# Patient Record
Sex: Female | Born: 1960 | Race: White | Hispanic: No | Marital: Married | Smoking: Former smoker
Health system: Southern US, Community
[De-identification: ages and names within clinical notes are randomized; demographics above are authoritative.]

## PROBLEM LIST (undated history)

## (undated) HISTORY — PX: OTHER SURGICAL HISTORY: SHX169

## (undated) HISTORY — PX: FREE FLAP GRAFT: SHX1677

---

## 2012-06-13 ENCOUNTER — Ambulatory Visit: Payer: Self-pay | Admitting: Physician Assistant

## 2012-08-25 ENCOUNTER — Encounter: Payer: Self-pay | Admitting: Nurse Practitioner

## 2012-09-08 ENCOUNTER — Encounter: Payer: Self-pay | Admitting: Nurse Practitioner

## 2012-10-08 ENCOUNTER — Encounter: Payer: Self-pay | Admitting: Nurse Practitioner

## 2014-02-21 ENCOUNTER — Ambulatory Visit: Payer: Self-pay | Admitting: Family Medicine

## 2014-04-16 ENCOUNTER — Emergency Department: Payer: Self-pay | Admitting: Emergency Medicine

## 2014-04-16 LAB — CBC
HCT: 39.3 % (ref 35.0–47.0)
HGB: 13.3 g/dL (ref 12.0–16.0)
MCH: 30 pg (ref 26.0–34.0)
MCHC: 34 g/dL (ref 32.0–36.0)
MCV: 88 fL (ref 80–100)
Platelet: 219 10*3/uL (ref 150–440)
RBC: 4.45 10*6/uL (ref 3.80–5.20)
RDW: 12.8 % (ref 11.5–14.5)
WBC: 8.3 10*3/uL (ref 3.6–11.0)

## 2014-04-16 LAB — BASIC METABOLIC PANEL
ANION GAP: 6 — AB (ref 7–16)
BUN: 13 mg/dL (ref 7–18)
CALCIUM: 8.1 mg/dL — AB (ref 8.5–10.1)
CREATININE: 0.87 mg/dL (ref 0.60–1.30)
Chloride: 109 mmol/L — ABNORMAL HIGH (ref 98–107)
Co2: 28 mmol/L (ref 21–32)
EGFR (African American): >60
EGFR (Non-African Amer.): >60
GLUCOSE: 80 mg/dL (ref 65–99)
OSMOLALITY: 284 (ref 275–301)
POTASSIUM: 3.5 mmol/L (ref 3.5–5.1)
Sodium: 143 mmol/L (ref 136–145)

## 2014-04-16 LAB — URINALYSIS, COMPLETE
BACTERIA: NONE SEEN
Bilirubin,UR: NEGATIVE
Blood: NEGATIVE
Glucose,UR: NEGATIVE mg/dL (ref 0–75)
KETONE: NEGATIVE
Leukocyte Esterase: NEGATIVE
NITRITE: NEGATIVE
Ph: 6 (ref 4.5–8.0)
Protein: NEGATIVE
RBC,UR: <1 /HPF (ref 0–5)
SPECIFIC GRAVITY: 1.006 (ref 1.003–1.030)
Squamous Epithelial: NONE SEEN
WBC UR: <1 /HPF (ref 0–5)

## 2014-04-16 LAB — HEPATIC FUNCTION PANEL A (ARMC)
ALT: 23 U/L
Albumin: 3.2 g/dL — ABNORMAL LOW (ref 3.4–5.0)
Alkaline Phosphatase: 77 U/L
Bilirubin, Direct: 0.2 mg/dL (ref 0.0–0.2)
Bilirubin,Total: 1.1 mg/dL — ABNORMAL HIGH (ref 0.2–1.0)
SGOT(AST): 22 U/L (ref 15–37)
TOTAL PROTEIN: 6.7 g/dL (ref 6.4–8.2)

## 2014-04-16 LAB — LIPASE, BLOOD: Lipase: 88 U/L (ref 73–393)

## 2019-03-15 HISTORY — PX: OPEN REDUCTION NASAL FRACTURE: SHX2105

## 2019-03-15 HISTORY — PX: ORIF ANKLE FRACTURE BIMALLEOLAR: SUR920

## 2019-03-19 HISTORY — PX: OPEN REDUCTION INTERNAL FIXATION (ORIF) TIBIA/FIBULA FRACTURE: SHX5992

## 2019-03-19 HISTORY — PX: OTHER SURGICAL HISTORY: SHX169

## 2019-04-12 ENCOUNTER — Other Ambulatory Visit: Payer: Self-pay | Admitting: Gerontology

## 2019-04-12 DIAGNOSIS — E042 Nontoxic multinodular goiter: Secondary | ICD-10-CM

## 2019-04-22 ENCOUNTER — Ambulatory Visit: Payer: Self-pay

## 2019-09-08 HISTORY — PX: OTHER SURGICAL HISTORY: SHX169

## 2020-02-11 ENCOUNTER — Other Ambulatory Visit: Payer: Self-pay | Admitting: Gerontology

## 2020-02-11 DIAGNOSIS — Z1231 Encounter for screening mammogram for malignant neoplasm of breast: Secondary | ICD-10-CM

## 2020-03-24 ENCOUNTER — Ambulatory Visit
Admission: RE | Admit: 2020-03-24 | Discharge: 2020-03-24 | Disposition: A | Discharge status: Home / Self Care | Payer: BC Managed Care – PPO | Source: Ambulatory Visit | Attending: Gerontology | Admitting: Gerontology

## 2020-03-24 ENCOUNTER — Other Ambulatory Visit: Payer: Self-pay

## 2020-03-24 DIAGNOSIS — Z1231 Encounter for screening mammogram for malignant neoplasm of breast: Secondary | ICD-10-CM | POA: Diagnosis present

## 2020-03-27 ENCOUNTER — Inpatient Hospital Stay
Admission: RE | Admit: 2020-03-27 | Discharge: 2020-03-27 | Disposition: A | Discharge status: Home / Self Care | Payer: Self-pay | Source: Ambulatory Visit | Attending: *Deleted | Admitting: *Deleted

## 2020-03-27 ENCOUNTER — Other Ambulatory Visit: Payer: Self-pay | Admitting: *Deleted

## 2020-03-27 DIAGNOSIS — Z1231 Encounter for screening mammogram for malignant neoplasm of breast: Secondary | ICD-10-CM

## 2020-10-28 HISTORY — PX: ANKLE FUSION: SHX881

## 2020-11-20 ENCOUNTER — Ambulatory Visit: Payer: BC Managed Care – PPO | Admitting: Dermatology

## 2021-01-02 ENCOUNTER — Ambulatory Visit: Payer: BC Managed Care – PPO | Admitting: Dermatology

## 2021-01-02 ENCOUNTER — Other Ambulatory Visit: Payer: Self-pay

## 2021-01-02 DIAGNOSIS — L578 Other skin changes due to chronic exposure to nonionizing radiation: Secondary | ICD-10-CM | POA: Diagnosis not present

## 2021-01-02 DIAGNOSIS — L821 Other seborrheic keratosis: Secondary | ICD-10-CM | POA: Diagnosis not present

## 2021-01-02 DIAGNOSIS — L720 Epidermal cyst: Secondary | ICD-10-CM

## 2021-01-02 DIAGNOSIS — Z85828 Personal history of other malignant neoplasm of skin: Secondary | ICD-10-CM

## 2021-01-02 DIAGNOSIS — L905 Scar conditions and fibrosis of skin: Secondary | ICD-10-CM

## 2021-01-02 DIAGNOSIS — D485 Neoplasm of uncertain behavior of skin: Secondary | ICD-10-CM

## 2021-01-02 NOTE — Progress Notes (Signed)
   New Patient Visit  Subjective  Madeline Foster is a 60 y.o. female who presents for the following: New Patient (Initial Visit) (Patient here today concerning a scaly place on face that she noticed February. She also noticed a rough place near nose. She has a history of bcc. ).   The following portions of the chart were reviewed this encounter and updated as appropriate:       Review of Systems:  No other skin or systemic complaints except as noted in HPI or Assessment and Plan.  Objective  Well appearing patient in no apparent distress; mood and affect are within normal limits.  A focused examination was performed including face. Relevant physical exam findings are noted in the Assessment and Plan.  left cheek 5 mm pink firm depressed macule       Assessment & Plan  Neoplasm of uncertain behavior of skin left cheek  Skin / nail biopsy Type of biopsy: tangential   Informed consent: discussed and consent obtained   Patient was prepped and draped in usual sterile fashion: Area prepped with alcohol. Anesthesia: the lesion was anesthetized in a standard fashion   Anesthetic:  1% lidocaine w/ epinephrine 1-100,000 buffered w/ 8.4% NaHCO3 Instrument used: flexible razor blade   Hemostasis achieved with: pressure, aluminum chloride and electrodesiccation   Outcome: patient tolerated procedure well   Post-procedure details: wound care instructions given   Post-procedure details comment:  Ointment and small bandage applied  Specimen 1 - Surgical pathology Differential Diagnosis: scar r/o bcc   Check Margins: No  5 mm pink firm depressed macule   Scar r/o bcc   Rec. excision if BCC  Actinic Damage - chronic, secondary to cumulative UV radiation exposure/sun exposure over time - diffuse scaly erythematous macules with underlying dyspigmentation - Recommend daily broad spectrum sunscreen SPF 30+ to sun-exposed areas, reapply every 2 hours as needed.  - Recommend staying  in the shade or wearing long sleeves, sun glasses (UVA+UVB protection) and wide brim hats (4-inch brim around the entire circumference of the hat). - Call for new or changing lesions.  History of Basal Cell Carcinoma of the Skin - No evidence of recurrence today - Recommend regular full body skin exams - Recommend daily broad spectrum sunscreen SPF 30+ to sun-exposed areas, reapply every 2 hours as needed.  - Call if any new or changing lesions are noted between office visits  Seborrheic Keratoses - Stuck-on, waxy, tan-brown papules - Benign-appearing - Discussed benign etiology and prognosis. - Observe - Call for any changes    Return if symptoms worsen or fail to improve.  I, Ruthell Rummage, CMA, am acting as scribe for Brendolyn Patty, MD.  Documentation: I have reviewed the above documentation for accuracy and completeness, and I agree with the above.  Brendolyn Patty MD

## 2021-01-02 NOTE — Patient Instructions (Addendum)
Biopsy Wound Care Instructions  Leave the original bandage on for 24 hours if possible.  If the bandage becomes soaked or soiled before that time, it is OK to remove it and examine the wound.  A small amount of post-operative bleeding is normal.  If excessive bleeding occurs, remove the bandage, place gauze over the site and apply continuous pressure (no peeking) over the area for 30 minutes. If this does not work, please call our clinic as soon as possible or page your doctor if it is after hours.   Once a day, cleanse the wound with soap and water. It is fine to shower. If a thick crust develops you may use a Q-tip dipped into dilute hydrogen peroxide (mix 1:1 with water) to dissolve it.  Hydrogen peroxide can slow the healing process, so use it only as needed.    After washing, apply petroleum jelly (Vaseline) or an antibiotic ointment if your doctor prescribed one for you, followed by a bandage.    For best healing, the wound should be covered with a layer of ointment at all times. If you are not able to keep the area covered with a bandage to hold the ointment in place, this may mean re-applying the ointment several times a day.  Continue this wound care until the wound has healed and is no longer open.   Itching and mild discomfort is normal during the healing process. However, if you develop pain or severe itching, please call our office.   If you have any discomfort, you can take Tylenol (acetaminophen) or ibuprofen as directed on the bottle. (Please do not take these if you have an allergy to them or cannot take them for another reason).  Some redness, tenderness and white or yellow material in the wound is normal healing.  If the area becomes very sore and red, or develops a thick yellow-green material (pus), it may be infected; please notify us.    If you have stitches, return to clinic as directed to have the stitches removed. You will continue wound care for 2-3 days after the stitches  are removed.   Wound healing continues for up to one year following surgery. It is not unusual to experience pain in the scar from time to time during the interval.  If the pain becomes severe or the scar thickens, you should notify the office.    A slight amount of redness in a scar is expected for the first six months.  After six months, the redness will fade and the scar will soften and fade.  The color difference becomes less noticeable with time.  If there are any problems, return for a post-op surgery check at your earliest convenience.  To improve the appearance of the scar, you can use silicone scar gel, cream, or sheets (such as Mederma or Serica) every night for up to one year. These are available over the counter (without a prescription).  Please call our office at [phone] for any questions or concerns.         If you have any questions or concerns for your doctor, please call our main line at [phone] and press option 4 to reach your doctor's medical assistant. If no one answers, please leave a voicemail as directed and we will return your call as soon as possible. Messages left after 4 pm will be answered the following business day.   You may also send [place] a message via [place]. We typically respond to MyChart messages within  1-2 business days.  For prescription refills, please ask your pharmacy to contact our office. Our fax number is [phone].  If you have an urgent issue when the clinic is closed that cannot wait until the next business day, you can page your doctor at the number below.    Please note that while we do our best to be available for urgent issues outside of office hours, we are not available 24/7.   If you have an urgent issue and are unable to reach [place], you may choose to seek medical care at your doctor's office, retail clinic, urgent care center, or emergency room.  If you have a medical emergency, please immediately call 911 or go to the  emergency department.  Pager Numbers  - Dr. Nehemiah Massed: [phone]  - Dr. Laurence Ferrari: [phone]  - Dr. Nicole Kindred: [phone]  In the event of inclement weather, please call our main line at [phone] for an update on the status of any delays or closures.  Dermatology Medication Tips: Please keep the boxes that topical medications come in in order to help keep track of the instructions about where and how to use these. Pharmacies typically print the medication instructions only on the boxes and not directly on the medication tubes.   If your medication is too expensive, please contact our office at [phone] option 4 or send [place] a message through [place].   We are unable to tell what your co-pay for medications will be in advance as this is different depending on your insurance coverage. However, we may be able to find a substitute medication at lower cost or fill out paperwork to get insurance to cover a needed medication.   If a prior authorization is required to get your medication covered by your insurance company, please allow [place] 1-2 business days to complete this process.  Drug prices often vary depending on where the prescription is filled and some pharmacies may offer cheaper prices.  The website www.goodrx.com contains coupons for medications through different pharmacies. The prices here do not account for what the cost may be with help from insurance (it may be cheaper with your insurance), but the website can give you the price if you did not use any insurance.  - You can print the associated coupon and take it with your prescription to the pharmacy.  - You may also stop by our office during regular business hours and pick up a GoodRx coupon card.  - If you need your prescription sent electronically to a different pharmacy, notify our office through [facility] or by phone at [phone] option 4.

## 2021-01-03 ENCOUNTER — Other Ambulatory Visit: Payer: Self-pay | Admitting: Gerontology

## 2021-01-03 DIAGNOSIS — Z1231 Encounter for screening mammogram for malignant neoplasm of breast: Secondary | ICD-10-CM

## 2021-01-10 ENCOUNTER — Telehealth: Payer: Self-pay

## 2021-01-10 NOTE — Telephone Encounter (Signed)
-----   Message from Brendolyn Patty, MD sent at 01/10/2021  9:33 AM EDT ----- Skin , left cheek DERMAL SCAR AND EPIDERMAL INCLUSION CYST, BASE INVOLVED   Benign cyst with scar- Could observe for recurrence OR could schedule for excision to remove rest of cyst, would leave a line scar- please call patient

## 2021-01-10 NOTE — Telephone Encounter (Signed)
Advised pt of bx results.  Advised we could observe for recurrence or schedule a surgery to remove remainder of cyst.  Pt prefers to observe./sh

## 2021-01-10 NOTE — Addendum Note (Signed)
Addended by: Brendolyn Patty on: 01/10/2021 11:04 AM   Modules accepted: Level of Service

## 2021-04-25 ENCOUNTER — Other Ambulatory Visit: Payer: Self-pay

## 2021-04-25 ENCOUNTER — Ambulatory Visit
Admission: RE | Admit: 2021-04-25 | Discharge: 2021-04-25 | Disposition: A | Discharge status: Home / Self Care | Payer: BC Managed Care – PPO | Source: Ambulatory Visit | Attending: Gerontology | Admitting: Gerontology

## 2021-04-25 DIAGNOSIS — Z1231 Encounter for screening mammogram for malignant neoplasm of breast: Secondary | ICD-10-CM | POA: Insufficient documentation

## 2021-05-01 ENCOUNTER — Other Ambulatory Visit: Payer: Self-pay | Admitting: Gerontology

## 2021-05-01 DIAGNOSIS — R928 Other abnormal and inconclusive findings on diagnostic imaging of breast: Secondary | ICD-10-CM

## 2021-05-01 DIAGNOSIS — N6320 Unspecified lump in the left breast, unspecified quadrant: Secondary | ICD-10-CM

## 2021-05-15 ENCOUNTER — Other Ambulatory Visit: Payer: Self-pay

## 2021-05-15 ENCOUNTER — Ambulatory Visit
Admission: RE | Admit: 2021-05-15 | Discharge: 2021-05-15 | Disposition: A | Discharge status: Home / Self Care | Payer: BC Managed Care – PPO | Source: Ambulatory Visit | Attending: Gerontology | Admitting: Gerontology

## 2021-05-15 DIAGNOSIS — R928 Other abnormal and inconclusive findings on diagnostic imaging of breast: Secondary | ICD-10-CM | POA: Insufficient documentation

## 2021-05-15 DIAGNOSIS — N6320 Unspecified lump in the left breast, unspecified quadrant: Secondary | ICD-10-CM | POA: Diagnosis present

## 2021-05-31 ENCOUNTER — Encounter: Payer: Self-pay | Admitting: Gastroenterology

## 2021-05-31 NOTE — H&P (Signed)
Pre-Procedure H&P   Patient ID: Madeline Foster is a 60 y.o. female.  Gastroenterology Provider: Annamaria Helling, DO  Referring Provider: Toni Arthurs, NP PCP: Latanya Maudlin, NP  Date: 06/01/2021  HPI [name] is a 60 y.o. female who presents today for Colonoscopy for screening colonoscopy.  Patient underwent screening colonoscopy in October 2012 with no polyps found.  Recommended 10-year follow-up.  Sigmoid diverticulosis was noted.  Current hemoglobin is 15 with MCV of 86.  Platelets 287,000 with creatinine of 0.8.  She has no family history of colorectal cancer or colon polyps.  She does have a history of traumatic brain injury. No longer taking 325 mg aspirin No current GI complaints   Past Medical History:  Diagnosis Date   Arthritis    Cancer ([place])    Cervical paraspinal muscle spasm    Headache    History of kidney stones    History of traumatic brain injury    Multiple thyroid nodules    Poor intravenous access    Subarachnoid hemorrhage ([facility]CC)     Past Surgical History:  Procedure Laterality Date   ANKLE FUSION Right 10/28/2020   FREE FLAP GRAFT     OPEN REDUCTION INTERNAL FIXATION (ORIF) TIBIA/FIBULA FRACTURE Right 03/19/2019   OPEN REDUCTION NASAL FRACTURE  03/15/2019   ORIF ANKLE FRACTURE BIMALLEOLAR  03/15/2019   non weight bearing right leg   removal external fixator lower leg Right 03/19/2019   tenotomy percutaneous achilles tendon under general anesth  09/08/2019   transfer adjacent tissue foot Right     Family History No h/o GI disease or malignancy  Review of Systems  Constitutional:  Negative for activity change, appetite change, chills, diaphoresis, fatigue, fever and unexpected weight change.  HENT:  Negative for trouble swallowing and voice change.   Respiratory:  Negative for shortness of breath and wheezing.   Cardiovascular:  Negative for chest pain, palpitations and leg swelling.  Gastrointestinal:  Negative for  abdominal distention, abdominal pain, anal bleeding, blood in stool, constipation, diarrhea, nausea, rectal pain and vomiting.  Musculoskeletal:  Negative for arthralgias and myalgias.  Skin:  Negative for color change and pallor.  Neurological:  Positive for headaches. Negative for dizziness, syncope and weakness.  Psychiatric/Behavioral:  Negative for confusion.   All other systems reviewed and are negative.   Medications No current facility-administered medications on file prior to encounter.   Current Outpatient Medications on File Prior to Encounter  Medication Sig Dispense Refill   aspirin 325 MG tablet Take by mouth.     Calcium Carb-Cholecalciferol (OYSTER SHELL CALCIUM) 500-400 MG-UNIT TABS Take by mouth.     Cholecalciferol 50 MCG (2000 UT) CAPS Take 3 capsules daily for 3 months, then reduce to 1 capsule daily thereafter for Vitamin D Deficiency.      Pertinent medications related to GI and procedure were reviewed by me with the patient prior to the procedure   Current Facility-Administered Medications:    0.9 %  sodium chloride infusion, , Intravenous, Continuous, Annamaria Helling, DO, Last Rate: 20 mL/hr at 06/01/21 0943, New Bag at 06/01/21 6962      Allergies  Allergen Reactions   Vancomycin Rash    Other reaction(s): Vancomycin Infusion Reaction "Red man syndrome"   Eggs Or Egg-Derived Products Diarrhea   Allergies were reviewed by me prior to the procedure  Objective    Vitals:   06/01/21 0923  BP: (!) 146/103  Pulse: 79  Resp: 20  Temp: Marland Kitchen)  96.7 F (35.9 C)  TempSrc: Temporal  SpO2: 96%  Weight: 86.2 kg  Height: 5\' 4"  (1.626 m)     Physical Exam Vitals and nursing note reviewed.  Constitutional:      General: She is not in acute distress.    Appearance: Normal appearance. She is not ill-appearing, toxic-appearing or diaphoretic.  HENT:     Head: Normocephalic and atraumatic.     Nose: Nose normal.     Mouth/Throat:     Mouth: Mucous  membranes are moist.     Pharynx: Oropharynx is clear.  Eyes:     General: No scleral icterus.    Extraocular Movements: Extraocular movements intact.  Cardiovascular:     Rate and Rhythm: Normal rate and regular rhythm.     Heart sounds: Normal heart sounds. No murmur heard.   No friction rub. No gallop.  Pulmonary:     Effort: Pulmonary effort is normal. No respiratory distress.     Breath sounds: Normal breath sounds. No wheezing, rhonchi or rales.  Abdominal:     General: Bowel sounds are normal. There is no distension.     Palpations: Abdomen is soft.     Tenderness: There is no abdominal tenderness. There is no guarding or rebound.  Musculoskeletal:     Cervical back: Neck supple.     Right lower leg: No edema.     Left lower leg: No edema.     Comments: Surgical scars- r ankle, left shin  Skin:    General: Skin is warm and dry.     Coloration: Skin is not jaundiced or pale.  Neurological:     General: No focal deficit present.     Mental Status: She is alert and oriented to person, place, and time. Mental status is at baseline.  Psychiatric:        Mood and Affect: Mood normal.        Behavior: Behavior normal.        Thought Content: Thought content normal.        Judgment: Judgment normal.     Assessment:  [name] is a 60 y.o. female  who presents today for Colonoscopy for Screening colonoscopy 10-year.  Plan:  Colonoscopy with possible intervention today  Colonoscopy with possible biopsy, control of bleeding, polypectomy, and interventions as necessary has been discussed with the patient/patient representative. Informed consent was obtained from the patient/patient representative after explaining the indication, nature, and risks of the procedure including but not limited to death, bleeding, perforation, missed neoplasm/lesions, cardiorespiratory compromise, and reaction to medications. Opportunity for questions was given and appropriate answers were  provided. Patient/patient representative has verbalized understanding is amenable to undergoing the procedure.   Annamaria Helling, DO  [facility]  Portions of the record may have been created with voice recognition software. Occasional wrong-word or 'sound-a-like' substitutions may have occurred due to the inherent limitations of voice recognition software.  Read the chart carefully and recognize, using context, where substitutions may have occurred.

## 2021-06-01 ENCOUNTER — Encounter
Admission: RE | Disposition: A | Discharge status: Home / Self Care | Payer: Self-pay | Source: Home / Self Care | Attending: Gastroenterology

## 2021-06-01 ENCOUNTER — Encounter: Payer: Self-pay | Admitting: Gastroenterology

## 2021-06-01 ENCOUNTER — Other Ambulatory Visit: Payer: Self-pay

## 2021-06-01 ENCOUNTER — Ambulatory Visit
Admission: RE | Admit: 2021-06-01 | Discharge: 2021-06-01 | Disposition: A | Discharge status: Home / Self Care | Payer: BC Managed Care – PPO | Attending: Gastroenterology | Admitting: Gastroenterology

## 2021-06-01 ENCOUNTER — Ambulatory Visit: Payer: BC Managed Care – PPO | Admitting: Anesthesiology

## 2021-06-01 DIAGNOSIS — Z87891 Personal history of nicotine dependence: Secondary | ICD-10-CM | POA: Diagnosis not present

## 2021-06-01 DIAGNOSIS — M1990 Unspecified osteoarthritis, unspecified site: Secondary | ICD-10-CM | POA: Insufficient documentation

## 2021-06-01 DIAGNOSIS — K5730 Diverticulosis of large intestine without perforation or abscess without bleeding: Secondary | ICD-10-CM | POA: Insufficient documentation

## 2021-06-01 DIAGNOSIS — D122 Benign neoplasm of ascending colon: Secondary | ICD-10-CM | POA: Insufficient documentation

## 2021-06-01 DIAGNOSIS — D124 Benign neoplasm of descending colon: Secondary | ICD-10-CM | POA: Diagnosis not present

## 2021-06-01 DIAGNOSIS — Z87820 Personal history of traumatic brain injury: Secondary | ICD-10-CM | POA: Diagnosis not present

## 2021-06-01 DIAGNOSIS — Z1211 Encounter for screening for malignant neoplasm of colon: Secondary | ICD-10-CM | POA: Insufficient documentation

## 2021-06-01 DIAGNOSIS — K640 First degree hemorrhoids: Secondary | ICD-10-CM | POA: Insufficient documentation

## 2021-06-01 HISTORY — PX: COLONOSCOPY WITH PROPOFOL: SHX5780

## 2021-06-01 SURGERY — COLONOSCOPY WITH PROPOFOL
Anesthesia: General

## 2021-06-01 MED ORDER — EPHEDRINE 5 MG/ML INJ
INTRAVENOUS | Status: AC
  Filled 2021-06-01: qty 5

## 2021-06-01 MED ORDER — PROPOFOL 500 MG/50ML IV EMUL
INTRAVENOUS | Status: DC | PRN
Start: 2021-06-01 — End: 2021-06-01
  Administered 2021-06-01: 100 ug/kg/min via INTRAVENOUS

## 2021-06-01 MED ORDER — PROPOFOL 10 MG/ML IV BOLUS
INTRAVENOUS | Status: DC | PRN
  Administered 2021-06-01: 50 mg via INTRAVENOUS
  Administered 2021-06-01: 70 mg via INTRAVENOUS

## 2021-06-01 MED ORDER — LIDOCAINE HCL (CARDIAC) PF 100 MG/5ML IV SOSY
PREFILLED_SYRINGE | INTRAVENOUS | Status: DC | PRN
Start: 2021-06-01 — End: 2021-06-01
  Administered 2021-06-01: 80 mg via INTRAVENOUS

## 2021-06-01 MED ORDER — PROPOFOL 10 MG/ML IV BOLUS
INTRAVENOUS | Status: AC
  Filled 2021-06-01: qty 40

## 2021-06-01 MED ORDER — LIDOCAINE HCL (PF) 2 % IJ SOLN
INTRAMUSCULAR | Status: AC
  Filled 2021-06-01: qty 10

## 2021-06-01 MED ORDER — SODIUM CHLORIDE 0.9 % IV SOLN: INTRAVENOUS | Status: DC

## 2021-06-01 NOTE — Anesthesia Postprocedure Evaluation (Signed)
Anesthesia Post Note  Patient: Madeline Foster  Procedure(s) Performed: COLONOSCOPY WITH PROPOFOL  Patient location during evaluation: Phase II Anesthesia Type: General Level of consciousness: awake and alert, awake and oriented Pain management: pain level controlled Vital Signs Assessment: post-procedure vital signs reviewed and stable Respiratory status: spontaneous breathing, nonlabored ventilation and respiratory function stable Cardiovascular status: blood pressure returned to baseline and stable Postop Assessment: no apparent nausea or vomiting Anesthetic complications: no   No notable events documented.   Last Vitals:  Vitals:   06/01/21 1115 06/01/21 1125  BP: (!) 124/94 (!) (P) 146/94  Pulse:    Resp:    Temp:    SpO2:      Last Pain:  Vitals:   06/01/21 1125  TempSrc:   PainSc: (P) 0-No pain                 Phill Mutter

## 2021-06-01 NOTE — Anesthesia Preprocedure Evaluation (Signed)
Anesthesia Evaluation  Patient identified by MRN, date of birth, ID band Patient awake    Reviewed: Allergy & Precautions, NPO status , Patient's Chart, lab work & pertinent test results  Airway Mallampati: II  TM Distance: >3 FB Neck ROM: Full    Dental no notable dental hx.    Pulmonary neg pulmonary ROS, former smoker,    Pulmonary exam normal        Cardiovascular negative cardio ROS Normal cardiovascular exam     Neuro/Psych  Headaches, negative psych ROS   GI/Hepatic negative GI ROS, Neg liver ROS, Bowel prep,  Endo/Other  negative endocrine ROS  Renal/GU negative Renal ROS  negative genitourinary   Musculoskeletal  (+) Arthritis , Osteoarthritis,    Abdominal   Peds negative pediatric ROS (+)  Hematology negative hematology ROS (+)   Anesthesia Other Findings Arthritis    Cancer (HCC)    Cervical paraspinal muscle spasm  Headache    History of kidney stones   History of traumatic brain injury  Multiple thyroid nodules   Poor intravenous access   Subarachnoid hemorrhage (HCC       Reproductive/Obstetrics negative OB ROS                             Anesthesia Physical Anesthesia Plan  ASA: 3  Anesthesia Plan: General   Post-op Pain Management:    Induction: Intravenous  PONV Risk Score and Plan: 2 and Propofol infusion and TIVA  Airway Management Planned: Natural Airway and Nasal Cannula  Additional Equipment:   Intra-op Plan:   Post-operative Plan:   Informed Consent: I have reviewed the patients History and Physical, chart, labs and discussed the procedure including the risks, benefits and alternatives for the proposed anesthesia with the patient or authorized representative who has indicated his/her understanding and acceptance.       Plan Discussed with: CRNA, Anesthesiologist and Surgeon  Anesthesia Plan Comments:         Anesthesia Quick  Evaluation

## 2021-06-01 NOTE — Transfer of Care (Signed)
Immediate Anesthesia Transfer of Care Note  Patient: MARSHAL SCHRECENGOST  Procedure(s) Performed: COLONOSCOPY WITH PROPOFOL  Patient Location: PACU and Endoscopy Unit  Anesthesia Type:General  Level of Consciousness: awake, drowsy and patient cooperative  Airway & Oxygen Therapy: Patient Spontanous Breathing  Post-op Assessment: Report given to RN and Post -op Vital signs reviewed and stable  Post vital signs: Reviewed and stable  Last Vitals:  Vitals Value Taken Time  BP 91/59 06/01/21 1057  Temp 36.1 C 06/01/21 1055  Pulse 60 06/01/21 1057  Resp 14 06/01/21 1057  SpO2 96 % 06/01/21 1057  Vitals shown include unvalidated device data.  Last Pain:  Vitals:   06/01/21 1055  TempSrc: Temporal  PainSc:          Complications: No notable events documented.

## 2021-06-01 NOTE — Interval H&P Note (Signed)
History and Physical Interval Note: Preprocedure H&P from 06/01/21  was reviewed and there was no interval change after seeing and examining the patient.  Written consent was obtained from the patient after discussion of risks, benefits, and alternatives. Patient has consented to proceed with Colonoscopy with possible intervention   06/01/2021 10:17 AM  Janey Genta  has presented today for surgery, with the diagnosis of colon cancer screening.  The various methods of treatment have been discussed with the patient and family. After consideration of risks, benefits and other options for treatment, the patient has consented to  Procedure(s): COLONOSCOPY WITH PROPOFOL (N/A) as a surgical intervention.  The patient's history has been reviewed, patient examined, no change in status, stable for surgery.  I have reviewed the patient's chart and labs.  Questions were answered to the patient's satisfaction.     Annamaria Helling

## 2021-06-01 NOTE — Op Note (Signed)
[facility] Gastroenterology Patient Name: Madeline Foster Procedure Date: 06/01/2021 10:09 AM MRN: [id number] Account #: [id number] Date of Birth: September 28, 1960 Admit Type: Outpatient Age: 60 Room: [facility] ENDO ROOM 3 Gender: Female Note Status: Finalized Instrument Name: Colonscope [id number] Procedure:             Colonoscopy Indications:           Screening for colorectal malignant neoplasm Providers:             Annamaria Helling DO, DO Medicines:             Monitored Anesthesia Care Complications:         No immediate complications. Estimated blood loss:                         Minimal. Procedure:             Pre-Anesthesia Assessment:                        - Prior to the procedure, a History and Physical was                         performed, and patient medications and allergies were                         reviewed. The patient is competent. The risks and                         benefits of the procedure and the sedation options and                         risks were discussed with the patient. All questions                         were answered and informed consent was obtained.                         Patient identification and proposed procedure were                         verified by the physician, the nurse, the anesthetist                         and the technician in the endoscopy suite. Mental                         Status Examination: alert and oriented. Airway                         Examination: normal oropharyngeal airway and neck                         mobility. Respiratory Examination: clear to                         auscultation. CV Examination: RRR, no murmurs, no S3                         or S4. Prophylactic Antibiotics: The patient does not  require prophylactic antibiotics. Prior                         Anticoagulants: The patient has taken no previous                         anticoagulant or antiplatelet agents.  ASA Grade                         Assessment: III - A patient with severe systemic                         disease. After reviewing the risks and benefits, the                         patient was deemed in satisfactory condition to                         undergo the procedure. The anesthesia plan was to use                         monitored anesthesia care (MAC). Immediately prior to                         administration of medications, the patient was                         re-assessed for adequacy to receive sedatives. The                         heart rate, respiratory rate, oxygen saturations,                         blood pressure, adequacy of pulmonary ventilation, and                         response to care were monitored throughout the                         procedure. The physical status of the patient was                         re-assessed after the procedure.                        After obtaining informed consent, the colonoscope was                         passed under direct vision. Throughout the procedure,                         the patient's blood pressure, pulse, and oxygen                         saturations were monitored continuously. The                         Colonoscope was introduced through the anus and  advanced to the the terminal ileum, with                         identification of the appendiceal orifice and IC                         valve. The colonoscopy was performed without                         difficulty. The patient tolerated the procedure well.                         The quality of the bowel preparation was evaluated                         using the BBPS [facility] Bowel Preparation Scale) with                         scores of: Right Colon = 3, Transverse Colon = 3 and                         Left Colon = 3 (entire mucosa seen well with no                         residual staining, small fragments of stool or opaque                          liquid). The total BBPS score equals 9. The ileocecal                         valve, appendiceal orifice, and rectum were                         photographed. Findings:      The perianal and digital rectal examinations were normal. Pertinent       negatives include normal sphincter tone.      Three sessile polyps were found in the ascending colon (2) and cecum       (1). The polyps were 1 to 2 mm in size. These polyps were removed with a       cold biopsy forceps. Resection and retrieval were complete. Estimated       blood loss was minimal.      Two sessile polyps were found in the descending colon. The polyps were 3       to 5 mm in size. These polyps were removed with a cold snare. Resection       and retrieval were complete. Estimated blood loss was minimal.      Non-bleeding internal hemorrhoids were found during retroflexion. The       hemorrhoids were Grade I (internal hemorrhoids that do not prolapse).      The terminal ileum appeared normal.      A few small-mouthed diverticula were found in the left colon. Estimated       blood loss: none.      The exam was otherwise without abnormality on direct and retroflexion       views. Impression:            - Three 1 to 2 mm  polyps in the ascending colon and in                         the cecum, removed with a cold biopsy forceps.                         Resected and retrieved.                        - Two 3 to 5 mm polyps in the descending colon,                         removed with a cold snare. Resected and retrieved.                        - Non-bleeding internal hemorrhoids.                        - The examined portion of the ileum was normal.                        - Diverticulosis in the left colon.                        - The examination was otherwise normal on direct and                         retroflexion views. Recommendation:        - Discharge patient to home.                        - Resume  previous diet.                        - Continue present medications.                        - Await pathology results.                        - Repeat colonoscopy for surveillance based on                         pathology results.                        - Return to GI office as previously scheduled. Procedure Code(s):     --- Professional ---                        [phone], Colonoscopy, flexible; with removal of                         tumor(s), polyp(s), or other lesion(s) by snare                         technique                        07371, 59, Colonoscopy, flexible; with biopsy, single  or multiple Diagnosis Code(s):     --- Professional ---                        Z12.11, Encounter for screening for malignant neoplasm                         of colon                        K63.5, Polyp of colon                        K64.0, First degree hemorrhoids CPT copyright 2019 American Medical Association. All rights reserved. The codes documented in this report are preliminary and upon coder review may  be revised to meet current compliance requirements. Attending Participation:      I personally performed the entire procedure. Volney American, DO Annamaria Helling DO, DO 06/01/2021 10:58:57 AM This report has been signed electronically. Number of Addenda: 0 Note Initiated On: 06/01/2021 10:09 AM Scope Withdrawal Time: 0 hours 21 minutes 32 seconds  Total Procedure Duration: 0 hours 25 minutes 51 seconds  Estimated Blood Loss:  Estimated blood loss was minimal.      [facility]

## 2021-06-05 LAB — SURGICAL PATHOLOGY

## 2022-04-02 ENCOUNTER — Ambulatory Visit
Admission: EM | Admit: 2022-04-02 | Discharge: 2022-04-02 | Disposition: A | Discharge status: Home / Self Care | Payer: BC Managed Care – PPO | Attending: Urgent Care | Admitting: Urgent Care

## 2022-04-02 DIAGNOSIS — N3001 Acute cystitis with hematuria: Secondary | ICD-10-CM | POA: Diagnosis present

## 2022-04-02 LAB — POCT URINALYSIS DIP (MANUAL ENTRY)
Bilirubin, UA: NEGATIVE
Glucose, UA: NEGATIVE mg/dL
Ketones, POC UA: NEGATIVE mg/dL
Nitrite, UA: NEGATIVE
Protein Ur, POC: NEGATIVE mg/dL
Spec Grav, UA: 1.02 (ref 1.010–1.025)
Urobilinogen, UA: 0.2 E.U./dL
pH, UA: 6 (ref 5.0–8.0)

## 2022-04-02 MED ORDER — NITROFURANTOIN MONOHYD MACRO 100 MG PO CAPS: 100.0000 mg | ORAL_CAPSULE | Freq: Two times a day (BID) | ORAL | 0 refills | Status: AC

## 2022-04-02 NOTE — ED Provider Notes (Signed)
[facility]    CSN: [id number] Arrival date & time: 04/02/22  1929      History   Chief Complaint Chief Complaint  Patient presents with   Urinary Frequency   Dysuria    HPI Madeline Foster is a 61 y.o. female.    Urinary Frequency  Dysuria   Presents to UC with complaint of urinary frequency, back pain, dysuria.  Symptoms starting in September and self treating with cranberry juice.  She states symptoms returned in the past week.  Also expresses concern for a kidney stone.  Past Medical History:  Diagnosis Date   Arthritis    Cancer ([place])    Cervical paraspinal muscle spasm    Headache    History of kidney stones    History of traumatic brain injury    Multiple thyroid nodules    Poor intravenous access    Subarachnoid hemorrhage (HCC)     There are no problems to display for this patient.   Past Surgical History:  Procedure Laterality Date   ANKLE FUSION Right 10/28/2020   COLONOSCOPY WITH PROPOFOL N/A 06/01/2021   Procedure: COLONOSCOPY WITH PROPOFOL;  Surgeon: Annamaria Helling, DO;  Location: [facility] ENDOSCOPY;  Service: Gastroenterology;  Laterality: N/A;   FREE FLAP GRAFT     OPEN REDUCTION INTERNAL FIXATION (ORIF) TIBIA/FIBULA FRACTURE Right 03/19/2019   OPEN REDUCTION NASAL FRACTURE  03/15/2019   ORIF ANKLE FRACTURE BIMALLEOLAR  03/15/2019   non weight bearing right leg   removal external fixator lower leg Right 03/19/2019   tenotomy percutaneous achilles tendon under general anesth  09/08/2019   transfer adjacent tissue foot Right     OB History   No obstetric history on file.      Home Medications    Prior to Admission medications   Medication Sig Start Date End Date Taking? Authorizing Provider  cyanocobalamin (VITAMIN B12) 1000 MCG tablet Take 2 tablets daily for 2 weeks, then reduce to 1 tablet daily thereafter for Vitamin B12 Deficiency. 01/05/21  Yes [provider]  polyethylene glycol-electrolytes (NULYTELY)  420 g solution Take by mouth. 03/28/21  Yes [provider]  acetaminophen (TYLENOL) 500 MG tablet Take by mouth.    [provider]  Calcium Carb-Cholecalciferol (OYSTER SHELL CALCIUM) 500-400 MG-UNIT TABS Take by mouth.    [provider]  Cholecalciferol 50 MCG (2000 UT) CAPS Take 3 capsules daily for 3 months, then reduce to 1 capsule daily thereafter for Vitamin D Deficiency. 04/27/19   [provider]  nitrofurantoin, macrocrystal-monohydrate, (MACROBID) 100 MG capsule     [provider]  phenazopyridine (PYRIDIUM) 100 MG tablet     [provider]  sulfamethoxazole-trimethoprim (BACTRIM DS) 800-160 MG tablet     [provider]    Family History Family History  Problem Relation Age of Onset   Breast cancer Maternal Aunt 50   Breast cancer Other 16    Social History Social History   Tobacco Use   Smoking status: Former    Types: Cigarettes  Scientific laboratory technician Use: Never used  Substance Use Topics   Alcohol use: Not Currently   Drug use: Not Currently     Allergies   Vancomycin and Eggs or egg-derived products   Review of Systems Review of Systems  Genitourinary:  Positive for dysuria and frequency.     Physical Exam Triage Vital Signs ED Triage Vitals  Enc Vitals Group     BP 04/02/22 1943 (!) 137/92  Pulse Rate 04/02/22 1943 90     Resp 04/02/22 1943 17     Temp 04/02/22 1943 97.7 F (36.5 C)     Temp src --      SpO2 04/02/22 1943 97 %     Weight --      Height --      Head Circumference --      Peak Flow --      Pain Score 04/02/22 1944 5     Pain Loc --      Pain Edu? --      Excl. in [place]? --    No data found.  Updated Vital Signs BP (!) 137/92   Pulse 90   Temp 97.7 F (36.5 C)   Resp 17   SpO2 97%   Visual Acuity Right Eye Distance:   Left Eye Distance:   Bilateral Distance:    Right Eye Near:   Left Eye Near:    Bilateral Near:     Physical Exam Vitals  reviewed.  Constitutional:      Appearance: Normal appearance.  Skin:    General: Skin is warm and dry.  Neurological:     General: No focal deficit present.     Mental Status: She is alert and oriented to person, place, and time.  Psychiatric:        Mood and Affect: Mood normal.        Behavior: Behavior normal.      UC Treatments / Results  Labs (all labs ordered are listed, but only abnormal results are displayed) Labs Reviewed  POCT URINALYSIS DIP (MANUAL ENTRY)    EKG   Radiology No results found.  Procedures Procedures (including critical care time)  Medications Ordered in UC Medications - No data to display  Initial Impression / Assessment and Plan / UC Course  I have reviewed the triage vital signs and the nursing notes.  Pertinent labs & imaging results that were available during my care of the patient were reviewed by me and considered in my medical decision making (see chart for details).   Patient is positive for leukocytes and blood.  Will treat for acute cystitis with hematuria.  She denies fever or chills.  Medicine   Final Clinical Impressions(s) / UC Diagnoses   Final diagnoses:  None   Discharge Instructions   None    ED Prescriptions   None    PDMP not reviewed this encounter.   Rose Phi, [place] 04/02/22 1956

## 2022-04-02 NOTE — ED Triage Notes (Signed)
Pt. Presents to UC w/ c/o urinary frequency, back pain and dysuria. Pt. States her symptoms started in September and she has been self treating with Cranberry juice and Cystex. Pt. States over the past week the symptoms have returned. Pt. Express concern for a Kidney stone.

## 2022-04-02 NOTE — Discharge Instructions (Addendum)
Follow up here or with your primary care provider if your symptoms are worsening or not improving with treatment.     

## 2022-04-05 LAB — URINE CULTURE: Culture: 40000 — AB

## 2022-04-18 ENCOUNTER — Other Ambulatory Visit: Payer: Self-pay | Admitting: Gerontology

## 2022-04-18 DIAGNOSIS — Z1231 Encounter for screening mammogram for malignant neoplasm of breast: Secondary | ICD-10-CM

## 2022-06-01 ENCOUNTER — Ambulatory Visit
Admission: RE | Admit: 2022-06-01 | Discharge: 2022-06-01 | Disposition: A | Discharge status: Home / Self Care | Payer: BC Managed Care – PPO | Source: Ambulatory Visit | Attending: Urgent Care | Admitting: Urgent Care

## 2022-06-01 VITALS — BP 130/69 | HR 78 | Temp 98.0°F | Resp 18

## 2022-06-01 DIAGNOSIS — R399 Unspecified symptoms and signs involving the genitourinary system: Secondary | ICD-10-CM | POA: Diagnosis not present

## 2022-06-01 DIAGNOSIS — N3000 Acute cystitis without hematuria: Secondary | ICD-10-CM

## 2022-06-01 LAB — POCT URINALYSIS DIP (MANUAL ENTRY)
Bilirubin, UA: NEGATIVE
Blood, UA: NEGATIVE
Glucose, UA: NEGATIVE mg/dL
Ketones, POC UA: NEGATIVE mg/dL
Nitrite, UA: NEGATIVE
Protein Ur, POC: NEGATIVE mg/dL
Spec Grav, UA: 1.01 (ref 1.010–1.025)
Urobilinogen, UA: 0.2 E.U./dL
pH, UA: 6 (ref 5.0–8.0)

## 2022-06-01 MED ORDER — NITROFURANTOIN MONOHYD MACRO 100 MG PO CAPS: 100.0000 mg | ORAL_CAPSULE | Freq: Two times a day (BID) | ORAL | 0 refills | Status: AC

## 2022-06-01 NOTE — ED Provider Notes (Signed)
[facility]    CSN: [id number] Arrival date & time: 06/01/22  1104      History   Chief Complaint Chief Complaint  Patient presents with   Urinary Frequency    UTI - have pain with urination, frequent with little urine, dark urine - Entered by patient   Dysuria    HPI Madeline Foster is a 61 y.o. female.   HPI  Presents to urgent care with complaint of dysuria and frequency x 3 days.  Patient is using OTC medication for symptom control.  She denies abdominal pain, flank pain, fever.  Most recent UTI at 04/02/2022 when she was treated with Macrobid.  Past Medical History:  Diagnosis Date   Arthritis    Cancer ([place])    Cervical paraspinal muscle spasm    Headache    History of kidney stones    History of traumatic brain injury    Multiple thyroid nodules    Poor intravenous access    Subarachnoid hemorrhage (HCC)     There are no problems to display for this patient.   Past Surgical History:  Procedure Laterality Date   ANKLE FUSION Right 10/28/2020   COLONOSCOPY WITH PROPOFOL N/A 06/01/2021   Procedure: COLONOSCOPY WITH PROPOFOL;  Surgeon: Annamaria Helling, DO;  Location: [facility] ENDOSCOPY;  Service: Gastroenterology;  Laterality: N/A;   FREE FLAP GRAFT     OPEN REDUCTION INTERNAL FIXATION (ORIF) TIBIA/FIBULA FRACTURE Right 03/19/2019   OPEN REDUCTION NASAL FRACTURE  03/15/2019   ORIF ANKLE FRACTURE BIMALLEOLAR  03/15/2019   non weight bearing right leg   removal external fixator lower leg Right 03/19/2019   tenotomy percutaneous achilles tendon under general anesth  09/08/2019   transfer adjacent tissue foot Right     OB History   No obstetric history on file.      Home Medications    Prior to Admission medications   Medication Sig Start Date End Date Taking? Authorizing Provider  acetaminophen (TYLENOL) 500 MG tablet Take by mouth.    [provider]  Calcium Carb-Cholecalciferol (OYSTER SHELL CALCIUM) 500-400 MG-UNIT TABS  Take by mouth.    [provider]  Cholecalciferol 50 MCG (2000 UT) CAPS Take 3 capsules daily for 3 months, then reduce to 1 capsule daily thereafter for Vitamin D Deficiency. 04/27/19   [provider]  cyanocobalamin (VITAMIN B12) 1000 MCG tablet Take 2 tablets daily for 2 weeks, then reduce to 1 tablet daily thereafter for Vitamin B12 Deficiency. 01/05/21   [provider]  phenazopyridine (PYRIDIUM) 100 MG tablet     [provider]  polyethylene glycol-electrolytes (NULYTELY) 420 g solution Take by mouth. 03/28/21   [provider]  sulfamethoxazole-trimethoprim (BACTRIM DS) 800-160 MG tablet     [provider]    Family History Family History  Problem Relation Age of Onset   Breast cancer Maternal Aunt 50   Breast cancer Other 72    Social History Social History   Tobacco Use   Smoking status: Former    Types: Cigarettes  Scientific laboratory technician Use: Never used  Substance Use Topics   Alcohol use: Not Currently   Drug use: Not Currently     Allergies   Vancomycin and Eggs or egg-derived products   Review of Systems Review of Systems   Physical Exam Triage Vital Signs ED Triage Vitals  Enc Vitals Group     BP 06/01/22 1119 130/69     Pulse Rate 06/01/22 1119 78  Resp 06/01/22 1119 18     Temp 06/01/22 1119 98 F (36.7 C)     Temp Source 06/01/22 1119 Oral     SpO2 06/01/22 1119 96 %     Weight --      Height --      Head Circumference --      Peak Flow --      Pain Score 06/01/22 1122 0     Pain Loc --      Pain Edu? --      Excl. in [place]? --    No data found.  Updated Vital Signs BP 130/69 (BP Location: Right Arm)   Pulse 78   Temp 98 F (36.7 C) (Oral)   Resp 18   SpO2 96%   Visual Acuity Right Eye Distance:   Left Eye Distance:   Bilateral Distance:    Right Eye Near:   Left Eye Near:    Bilateral Near:     Physical Exam   UC Treatments / Results  Labs (all labs ordered are  listed, but only abnormal results are displayed) Labs Reviewed  POCT URINALYSIS DIP (MANUAL ENTRY)    EKG   Radiology No results found.  Procedures Procedures (including critical care time)  Medications Ordered in UC Medications - No data to display  Initial Impression / Assessment and Plan / UC Course  I have reviewed the triage vital signs and the nursing notes.  Pertinent labs & imaging results that were available during my care of the patient were reviewed by me and considered in my medical decision making (see chart for details).   UA is not clearly suggestive of UTI.  There are trace leukocytes.  Patient has history of recurrent UTI.  Will treat with Macrobid and send urine for culture to confirm diagnosis.   Final Clinical Impressions(s) / UC Diagnoses   Final diagnoses:  UTI symptoms   Discharge Instructions   None    ED Prescriptions   None    PDMP not reviewed this encounter.   Rose Phi, [place] 06/01/22 1141

## 2022-06-01 NOTE — ED Triage Notes (Signed)
Pt. Presents to UC w/ c/o dysuria and urinary frequency for the past 3 days. Pt. Has been using OTC medication for Tx.

## 2022-06-01 NOTE — Discharge Instructions (Addendum)
Follow up here or with your primary care provider if your symptoms are worsening or not improving with treatment.     

## 2022-06-03 LAB — URINE CULTURE: Culture: <10000 — AB

## 2022-06-27 ENCOUNTER — Ambulatory Visit
Admission: RE | Admit: 2022-06-27 | Discharge: 2022-06-27 | Disposition: A | Discharge status: Home / Self Care | Payer: BC Managed Care – PPO | Source: Ambulatory Visit | Attending: Gerontology | Admitting: Gerontology

## 2022-06-27 DIAGNOSIS — Z1231 Encounter for screening mammogram for malignant neoplasm of breast: Secondary | ICD-10-CM | POA: Diagnosis not present

## 2023-06-30 ENCOUNTER — Other Ambulatory Visit: Payer: Self-pay | Admitting: Gerontology

## 2023-06-30 DIAGNOSIS — Z1231 Encounter for screening mammogram for malignant neoplasm of breast: Secondary | ICD-10-CM

## 2023-07-10 ENCOUNTER — Ambulatory Visit
Admission: RE | Admit: 2023-07-10 | Discharge: 2023-07-10 | Disposition: A | Discharge status: Home / Self Care | Payer: 59 | Source: Ambulatory Visit | Attending: Gerontology | Admitting: Gerontology

## 2023-07-10 ENCOUNTER — Encounter: Payer: Self-pay | Admitting: Gerontology

## 2023-07-10 DIAGNOSIS — Z1231 Encounter for screening mammogram for malignant neoplasm of breast: Secondary | ICD-10-CM | POA: Insufficient documentation

## 2023-07-16 ENCOUNTER — Encounter: Payer: Self-pay | Admitting: Gerontology

## 2023-07-17 ENCOUNTER — Other Ambulatory Visit: Payer: Self-pay | Admitting: Nurse Practitioner

## 2023-07-17 DIAGNOSIS — N63 Unspecified lump in unspecified breast: Secondary | ICD-10-CM

## 2023-08-05 ENCOUNTER — Ambulatory Visit
Admission: RE | Admit: 2023-08-05 | Discharge: 2023-08-05 | Disposition: A | Discharge status: Home / Self Care | Payer: 59 | Source: Ambulatory Visit | Attending: Nurse Practitioner | Admitting: Nurse Practitioner

## 2023-08-05 DIAGNOSIS — N63 Unspecified lump in unspecified breast: Secondary | ICD-10-CM

## 2023-10-24 IMAGING — MG MM DIGITAL SCREENING BILAT W/ TOMO AND CAD
8 series · 8 of 24 positions shown · non-contrast
Comparison: Previous exam(s).

CLINICAL DATA: Screening.

EXAM:
DIGITAL SCREENING BILATERAL MAMMOGRAM WITH TOMOSYNTHESIS AND CAD
TECHNIQUE: Bilateral screening digital craniocaudal and mediolateral oblique
mammograms were obtained. Bilateral screening digital breast
tomosynthesis was performed. The images were evaluated with
computer-aided detection.

[R MLO synth-2D]
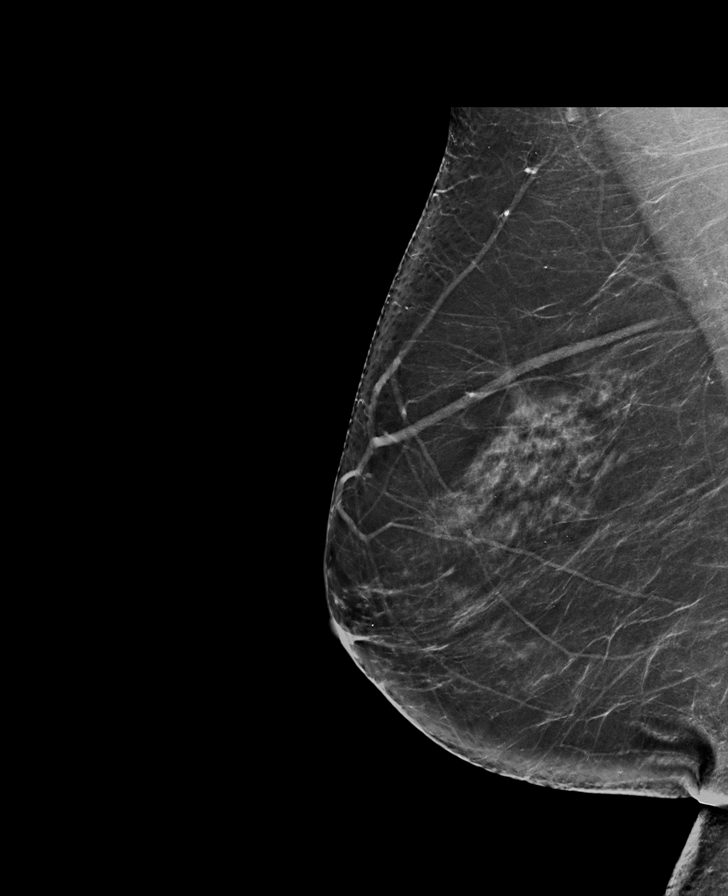

[L CC synth-2D]
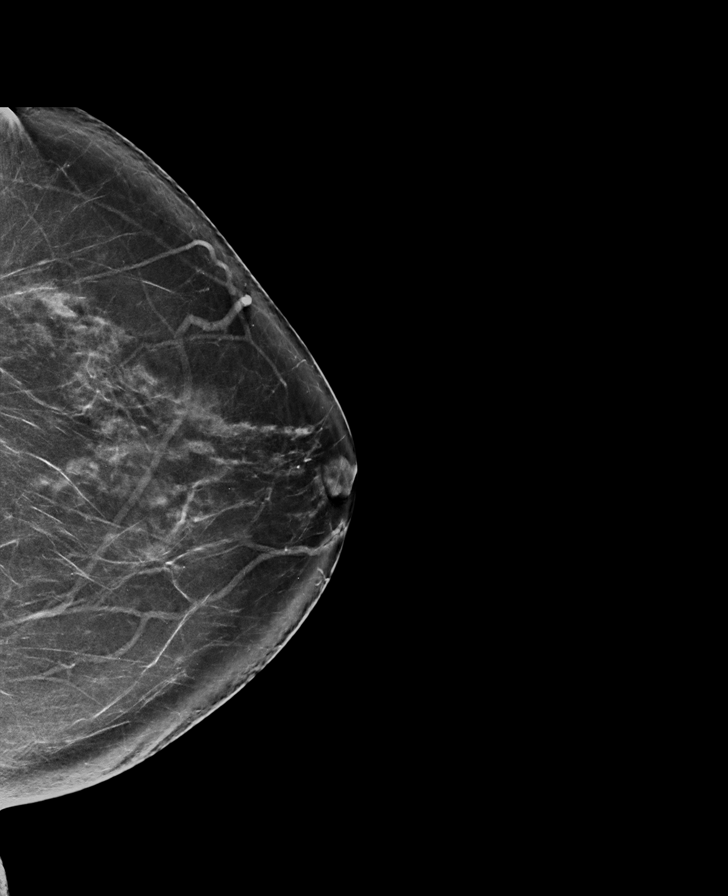

[R CC synth-2D]
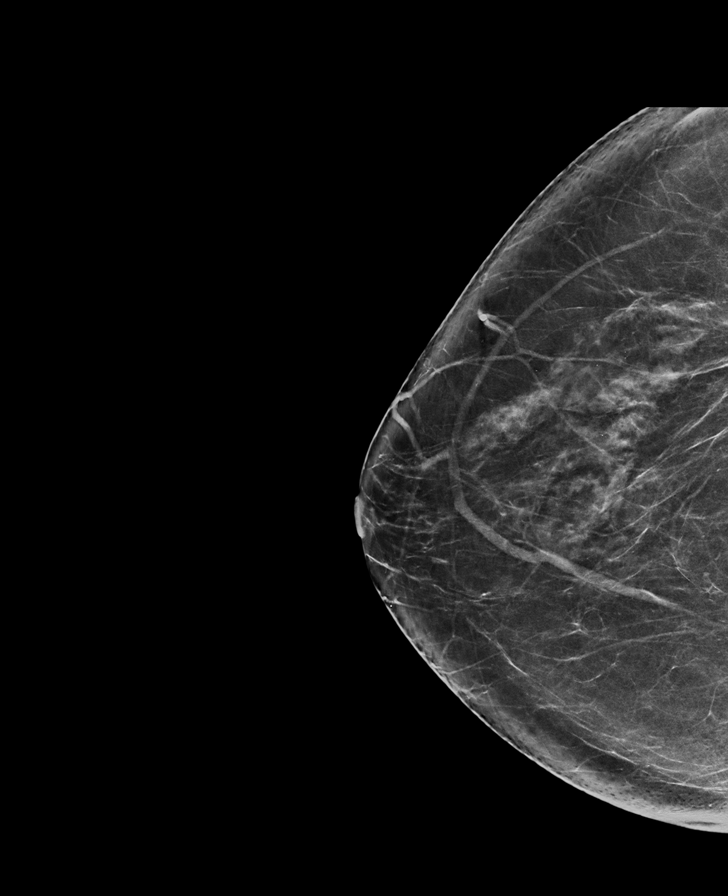

[L MLO synth-2D]
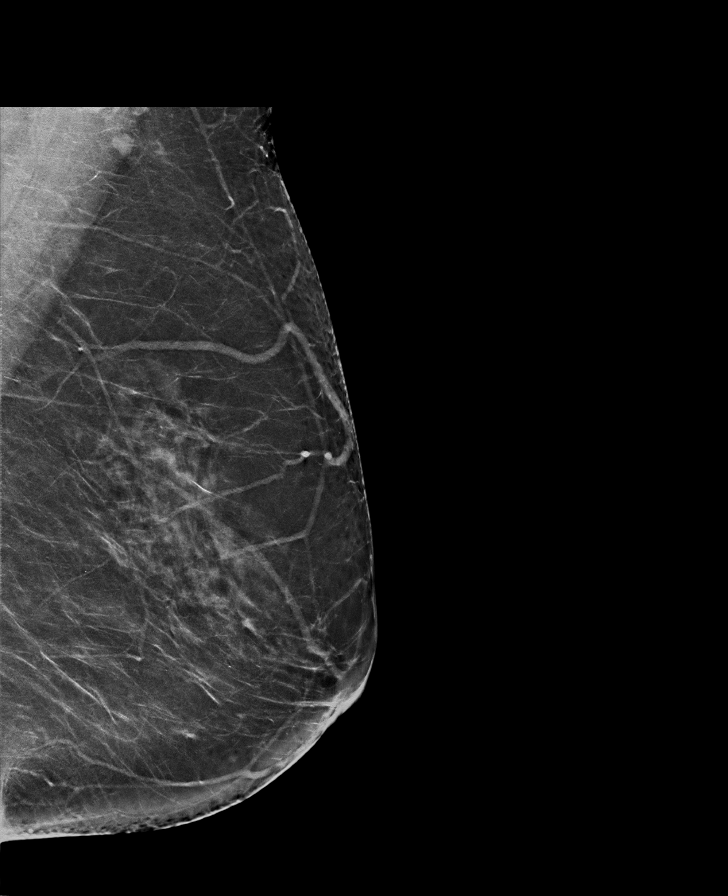

[L MLO tomo · tomo slice 40/79.0]
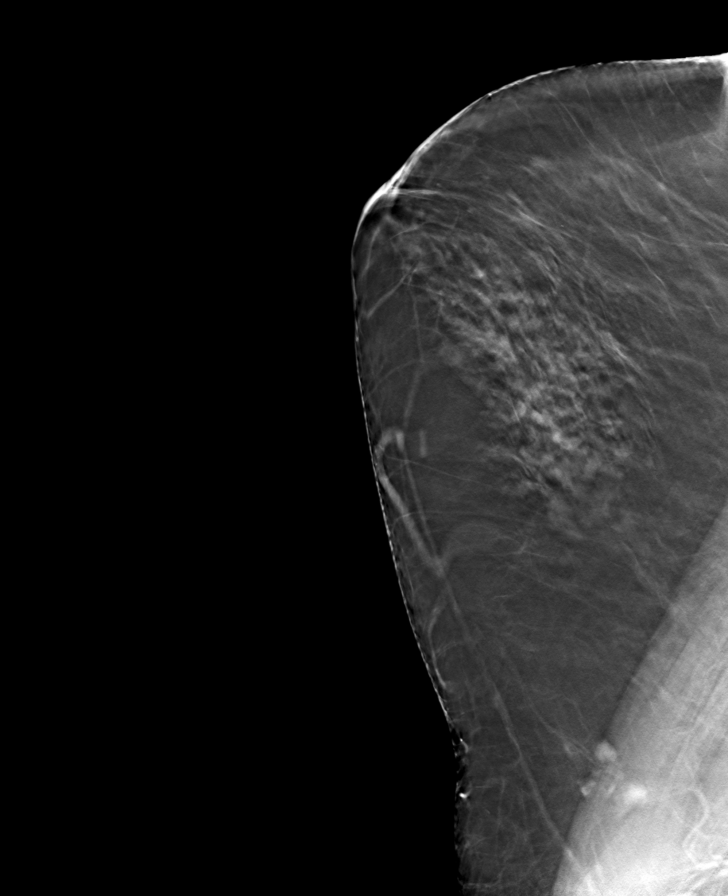

[L CC tomo · tomo slice 41/80.0]
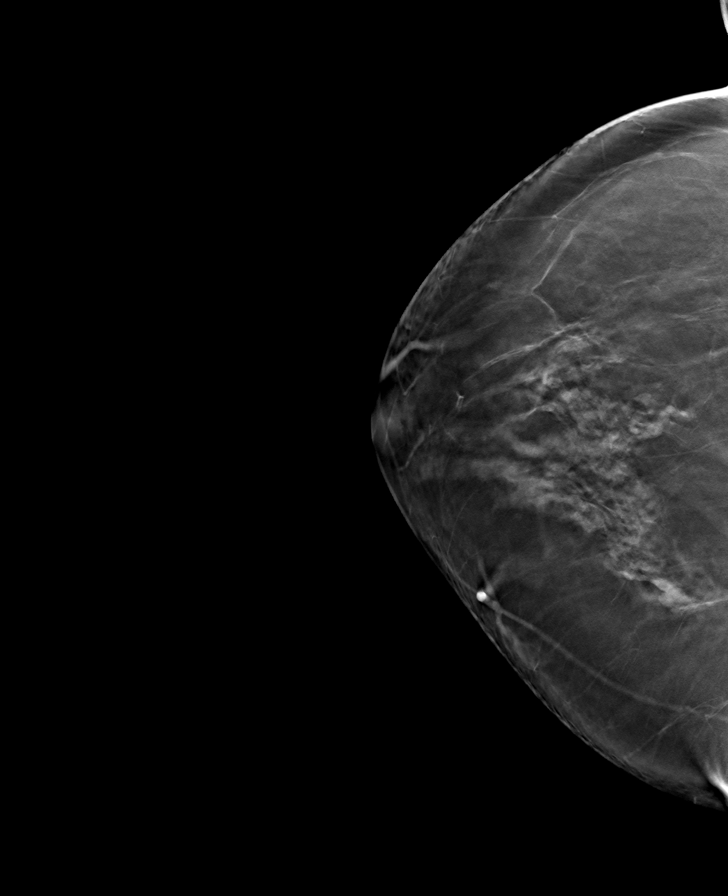

[R MLO tomo · tomo slice 40/79.0]
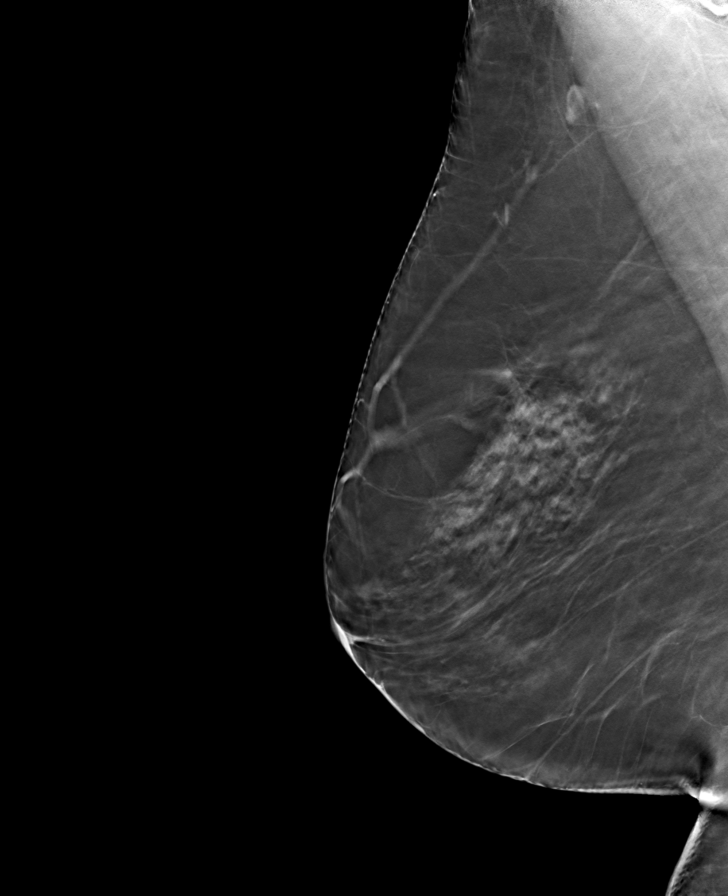

[R CC tomo · tomo slice 38/75.0]
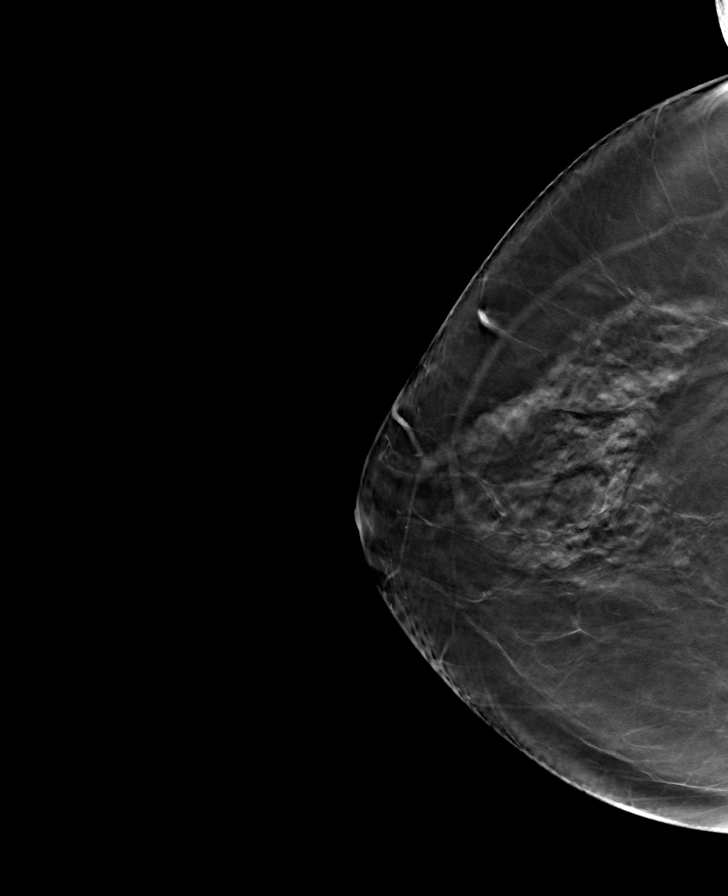

[8 of 24 positions shown; findings below may reference images not displayed]

ACR Breast Density Category c: The breast tissue is heterogeneously
dense, which may obscure small masses.
FINDINGS: In the left breast, a possible mass warrants further evaluation. In
the right breast, no findings suspicious for malignancy.
IMPRESSION: Further evaluation is suggested for a possible mass in the left
breast.

RECOMMENDATION:
Diagnostic mammogram and possibly ultrasound of the left breast.
(Code:C3-P-XXB)

The patient will be contacted regarding the findings, and additional
imaging will be scheduled.

BI-RADS CATEGORY  0: Incomplete. Need additional imaging evaluation
and/or prior mammograms for comparison.

## 2024-06-25 ENCOUNTER — Other Ambulatory Visit: Payer: Self-pay | Admitting: Gerontology

## 2024-06-25 DIAGNOSIS — Z1231 Encounter for screening mammogram for malignant neoplasm of breast: Secondary | ICD-10-CM
